# Patient Record
Sex: Female | Born: 2005
Health system: Southern US, Community
[De-identification: ages and names within clinical notes are randomized; demographics above are authoritative.]

## PROBLEM LIST (undated history)

## (undated) DIAGNOSIS — K561 Intussusception: Secondary | ICD-10-CM

## (undated) DIAGNOSIS — L309 Dermatitis, unspecified: Secondary | ICD-10-CM

## (undated) DIAGNOSIS — R519 Headache, unspecified: Secondary | ICD-10-CM

## (undated) DIAGNOSIS — F419 Anxiety disorder, unspecified: Secondary | ICD-10-CM

## (undated) HISTORY — DX: Headache, unspecified: R51.9

## (undated) HISTORY — DX: Dermatitis, unspecified: L30.9

## (undated) HISTORY — DX: Anxiety disorder, unspecified: F41.9

## (undated) HISTORY — DX: Intussusception: K56.1

---
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
## 2024-04-21 DIAGNOSIS — L700 Acne vulgaris: Secondary | ICD-10-CM | POA: Diagnosis not present

## 2024-05-06 DIAGNOSIS — Z1342 Encounter for screening for global developmental delays (milestones): Secondary | ICD-10-CM | POA: Diagnosis not present

## 2024-05-06 DIAGNOSIS — Z862 Personal history of diseases of the blood and blood-forming organs and certain disorders involving the immune mechanism: Secondary | ICD-10-CM | POA: Diagnosis not present

## 2024-05-06 DIAGNOSIS — Z3009 Encounter for other general counseling and advice on contraception: Secondary | ICD-10-CM | POA: Diagnosis not present

## 2024-05-06 DIAGNOSIS — Z1330 Encounter for screening examination for mental health and behavioral disorders, unspecified: Secondary | ICD-10-CM | POA: Diagnosis not present

## 2024-05-25 DIAGNOSIS — L700 Acne vulgaris: Secondary | ICD-10-CM | POA: Diagnosis not present

## 2024-05-26 ENCOUNTER — Emergency Department (HOSPITAL_BASED_OUTPATIENT_CLINIC_OR_DEPARTMENT_OTHER): Admission: EM | Admit: 2024-05-26 | Discharge: 2024-05-26 | Disposition: A | Discharge status: Home / Self Care

## 2024-05-26 ENCOUNTER — Encounter (HOSPITAL_BASED_OUTPATIENT_CLINIC_OR_DEPARTMENT_OTHER): Payer: Self-pay | Admitting: *Deleted

## 2024-05-26 ENCOUNTER — Other Ambulatory Visit: Payer: Self-pay

## 2024-05-26 ENCOUNTER — Emergency Department (HOSPITAL_BASED_OUTPATIENT_CLINIC_OR_DEPARTMENT_OTHER)

## 2024-05-26 DIAGNOSIS — R109 Unspecified abdominal pain: Secondary | ICD-10-CM | POA: Diagnosis not present

## 2024-05-26 DIAGNOSIS — K529 Noninfective gastroenteritis and colitis, unspecified: Secondary | ICD-10-CM | POA: Diagnosis not present

## 2024-05-26 DIAGNOSIS — D72829 Elevated white blood cell count, unspecified: Secondary | ICD-10-CM | POA: Diagnosis not present

## 2024-05-26 LAB — URINALYSIS, ROUTINE W REFLEX MICROSCOPIC
Bilirubin Urine: NEGATIVE
Glucose, UA: NEGATIVE mg/dL
Leukocytes,Ua: NEGATIVE
Nitrite: NEGATIVE
Protein, ur: 30 mg/dL — AB
Specific Gravity, Urine: 1.035 — ABNORMAL HIGH (ref 1.005–1.030)
pH: 5.5 (ref 5.0–8.0)

## 2024-05-26 LAB — COMPREHENSIVE METABOLIC PANEL WITH GFR
ALT: 18 U/L (ref 0–44)
AST: 27 U/L (ref 15–41)
Albumin: 4.8 g/dL (ref 3.5–5.0)
Alkaline Phosphatase: 112 U/L (ref 38–126)
Anion gap: 12 (ref 5–15)
BUN: 10 mg/dL (ref 6–20)
CO2: 25 mmol/L (ref 22–32)
Calcium: 10.3 mg/dL (ref 8.9–10.3)
Chloride: 98 mmol/L (ref 98–111)
Creatinine, Ser: 0.95 mg/dL (ref 0.44–1.00)
GFR, Estimated: >60 mL/min (ref >60)
Glucose, Bld: 113 mg/dL — ABNORMAL HIGH (ref 70–99)
Potassium: 3.8 mmol/L (ref 3.5–5.1)
Sodium: 136 mmol/L (ref 135–145)
Total Bilirubin: 0.5 mg/dL (ref 0.0–1.2)
Total Protein: 8 g/dL (ref 6.5–8.1)

## 2024-05-26 LAB — CBC
HCT: 41.9 % (ref 36.0–46.0)
Hemoglobin: 14.6 g/dL (ref 12.0–15.0)
MCH: 32.2 pg (ref 26.0–34.0)
MCHC: 34.8 g/dL (ref 30.0–36.0)
MCV: 92.3 fL (ref 80.0–100.0)
Platelets: 301 K/uL (ref 150–400)
RBC: 4.54 MIL/uL (ref 3.87–5.11)
RDW: 12.6 % (ref 11.5–15.5)
WBC: 12.4 K/uL — ABNORMAL HIGH (ref 4.0–10.5)
nRBC: 0 % (ref 0.0–0.2)

## 2024-05-26 LAB — LIPASE, BLOOD: Lipase: 37 U/L (ref 11–51)

## 2024-05-26 LAB — PREGNANCY, URINE: Preg Test, Ur: NEGATIVE

## 2024-05-26 MED ORDER — LACTATED RINGERS IV BOLUS
1000.0000 mL | Freq: Once | INTRAVENOUS | Status: AC
  Administered 2024-05-26: 08:00:00 1000 mL via INTRAVENOUS

## 2024-05-26 MED ORDER — DICYCLOMINE HCL 20 MG PO TABS: 20.0000 mg | ORAL_TABLET | Freq: Two times a day (BID) | ORAL | 0 refills | Status: AC

## 2024-05-26 MED ORDER — IOHEXOL 300 MG/ML  SOLN
100.0000 mL | Freq: Once | INTRAMUSCULAR | Status: AC | PRN
  Administered 2024-05-26: 08:00:00 75 mL via INTRAVENOUS

## 2024-05-26 MED ORDER — DICYCLOMINE HCL 10 MG PO CAPS
10.0000 mg | ORAL_CAPSULE | Freq: Once | ORAL | Status: AC
  Administered 2024-05-26: 09:00:00 10 mg via ORAL
  Filled 2024-05-26: qty 1

## 2024-05-26 MED ORDER — MORPHINE SULFATE (PF) 2 MG/ML IV SOLN
2.0000 mg | Freq: Once | INTRAVENOUS | Status: AC
  Administered 2024-05-26: 08:00:00 2 mg via INTRAVENOUS
  Filled 2024-05-26: qty 1

## 2024-05-26 MED ORDER — ONDANSETRON HCL 4 MG/2ML IJ SOLN
4.0000 mg | Freq: Once | INTRAMUSCULAR | Status: AC
  Administered 2024-05-26: 08:00:00 4 mg via INTRAVENOUS
  Filled 2024-05-26: qty 2

## 2024-05-26 MED ORDER — ONDANSETRON 4 MG PO TBDP: 4.0000 mg | ORAL_TABLET | Freq: Three times a day (TID) | ORAL | 0 refills | Status: AC | PRN

## 2024-05-26 NOTE — ED Triage Notes (Signed)
 Pt reports some abdominal discomfort that started on Sunday, she thought may be food intolerance related. The pain has become worse, pain just over the umbilicus. Tactile fevers. Mild nausea. No diarrhea.

## 2024-05-26 NOTE — Discharge Instructions (Addendum)
 Please follow-up with your primary doctor.  We have referred you to GI doctor for further evaluation of your ileitis.  Return for fevers, chills, lightheadedness, passout, chest pain, severe abdominal pain, inability to eat or drink due to nausea vomiting, stop having bowel movements or you develop any new or worsening symptoms that are concerning to you.  You may take the medications as needed symptom relief.  As discussed you may also try bowel rest and slow reintroduction of foods to help with your symptoms.

## 2024-05-26 NOTE — ED Provider Notes (Signed)
 [place] EMERGENCY DEPARTMENT AT [facility] Provider Note   CSN: [id number] Arrival date & time: 05/26/24  [id number]     Patient presents with: Abdominal Pain   Latoya White is a 18 y.o. female.   This is a 18 year old female presented to the emergency department for abdominal pain.  Started Sunday constant, but varies in intensity.  Seemingly worsening.  Pain was more in her upper abdomen but has gradually moved down.  Patient and mother noted fevers at home this morning.  She had some nausea in triage, but has not had any prior to that.  No vomiting.  Normal bowel movement this morning.  No prior surgeries.  Last menstrual period was a few weeks ago   Abdominal Pain      Prior to Admission medications  Medication Sig Start Date End Date Taking? Authorizing Provider  dicyclomine  (BENTYL ) 20 MG tablet Take 1 tablet (20 mg total) by mouth 2 (two) times daily. 05/26/24  Yes Neysa Caron PARAS, DO  ondansetron  (ZOFRAN -ODT) 4 MG disintegrating tablet Take 1 tablet (4 mg total) by mouth every 8 (eight) hours as needed for nausea or vomiting. 05/26/24  Yes Neysa Caron PARAS, DO  ibuprofen  (ADVIL ) 600 MG tablet Take 1 tablet at onset of migraine. May repeat q 4-6 hours as needed 11/16/19   Marianna City, NP  Melatonin 5 MG CAPS Take by mouth.    [provider]  methylphenidate  (CONCERTA ) 18 MG PO CR tablet Take 1 tablet (18 mg total) by mouth daily. Patient not taking: Reported on 01/28/2020 10/23/19   Viviana Fitch T, FNP  spironolactone (ALDACTONE) 100 MG tablet Take 100 mg by mouth daily. 09/20/20   [provider]  tretinoin (RETIN-A) 0.025 % cream Apply 1 application topically at bedtime. 10/01/20   [provider]    Allergies: Amoxicillin    Review of Systems  Gastrointestinal:  Positive for abdominal pain.    Updated Vital Signs BP 119/78   Pulse 82   Temp 99.3 F (37.4 C) (Oral)   Resp 17   Ht 5' 7 (1.702 m)   Wt 61.2 kg   SpO2 96%    BMI 21.14 kg/m   Physical Exam Vitals and nursing note reviewed.  Constitutional:      General: She is not in acute distress.    Appearance: She is not toxic-appearing.  HENT:     Head: Normocephalic.  Cardiovascular:     Rate and Rhythm: Normal rate and regular rhythm.  Pulmonary:     Effort: Pulmonary effort is normal.     Breath sounds: Normal breath sounds.  Abdominal:     General: Abdomen is flat.     Palpations: Abdomen is soft.     Tenderness: There is abdominal tenderness in the right lower quadrant, periumbilical area and left lower quadrant.  Skin:    General: Skin is warm and dry.     Capillary Refill: Capillary refill takes less than 2 seconds.  Neurological:     General: No focal deficit present.     Mental Status: She is alert.  Psychiatric:        Mood and Affect: Mood normal.        Behavior: Behavior normal.     (all labs ordered are listed, but only abnormal results are displayed) Labs Reviewed  COMPREHENSIVE METABOLIC PANEL WITH GFR - Abnormal; Notable for the following components:      Result Value   Glucose, Bld 113 (*)    All  other components within normal limits  CBC - Abnormal; Notable for the following components:   WBC 12.4 (*)    All other components within normal limits  URINALYSIS, ROUTINE W REFLEX MICROSCOPIC - Abnormal; Notable for the following components:   Specific Gravity, Urine 1.035 (*)    Hgb urine dipstick TRACE (*)    Ketones, ur TRACE (*)    Protein, ur 30 (*)    Bacteria, UA RARE (*)    All other components within normal limits  LIPASE, BLOOD  PREGNANCY, URINE    EKG: None  Radiology: CT ABDOMEN PELVIS W CONTRAST Result Date: 05/26/2024 EXAM: CT ABDOMEN AND PELVIS WITH CONTRAST 05/26/2024 08:02:48 AM TECHNIQUE: CT of the abdomen and pelvis was performed with the administration of 75 mL of iohexol  (OMNIPAQUE ) 300 MG/ML solution. Multiplanar reformatted images are provided for review. Automated exposure control,  iterative reconstruction, and/or weight-based adjustment of the mA/kV was utilized to reduce the radiation dose to as low as reasonably achievable. COMPARISON: None available. CLINICAL HISTORY: RLQ abdominal pain. FINDINGS: LOWER CHEST: No acute abnormality. LIVER: The liver is unremarkable. GALLBLADDER AND BILE DUCTS: Gallbladder is unremarkable. No biliary ductal dilatation. SPLEEN: The spleen is within normal limits in size and appearance. PANCREAS: The pancreas is normal in size and contour without focal lesion or ductal dilatation. ADRENAL GLANDS: Normal size and morphology bilaterally. No nodule, thickening, or hemorrhage. No periadrenal stranding. KIDNEYS, URETERS AND BLADDER: No stones in the kidneys or ureters. No hydronephrosis. No perinephric or periureteral stranding. Urinary bladder is unremarkable. GI AND BOWEL: Stomach demonstrates no acute abnormality. The appendix is visualized and appears within normal limits. There is wall thickening and mild mucosal enhancement involving the terminal ileum with wall thickening of the terminal ileum measuring up to 1 cm. No significant surrounding inflammatory soft tissue stranding. Mild diffuse colonic stool burden is identified. There is no bowel obstruction. PERITONEUM AND RETROPERITONEUM: Trace free fluid noted within the pelvis. No free air. VASCULATURE: Aorta is normal in caliber. LYMPH NODES: Right lower quadrant iliopsoas lymph nodes are prominent measuring up to 8 mm short axis. REPRODUCTIVE ORGANS: The uterus appears within normal limits. Left ovarian cyst measures 2 cm, image 20/2. BONES AND SOFT TISSUES: No acute osseous abnormality. No focal soft tissue abnormality. IMPRESSION: 1. Wall thickening and mild mucosal enhancement of the terminal ileum up to 1 cm without significant surrounding inflammatory stranding, which may reflect terminal ileitis. 2. Prominent right lower quadrant iliopsoas lymph nodes measuring up to 8 mm short axis, likely reactive.  3. Simple-appearing left ovarian cyst measuring 2 cm, no follow-up recommended. Electronically signed by: Waddell Calk MD 05/26/2024 08:24 AM EST RP Workstation: [id number]     Procedures   Medications Ordered in the ED  morphine  (PF) 2 MG/ML injection 2 mg (2 mg Intravenous Given 05/26/24 0744)  ondansetron  (ZOFRAN ) injection 4 mg (4 mg Intravenous Given 05/26/24 0744)  lactated ringers  bolus 1,000 mL (0 mLs Intravenous Stopped 05/26/24 0942)  iohexol  (OMNIPAQUE ) 300 MG/ML solution 100 mL (75 mLs Intravenous Contrast Given 05/26/24 0752)  dicyclomine  (BENTYL ) capsule 10 mg (10 mg Oral Given 05/26/24 0838)    Clinical Course as of 05/26/24 1427  Tue May 26, 2024  0842 Workup reassuring overall reassuring.  I suspect likely a viral gastroenteritis type clinical picture picture.  No significant electrolyte abnormalities.  Normal kidney function.  No transaminitis or elevated bilirubin to suggest acute hepatobiliary disease.  Lipase normal.  Pancreatitis unlikely.  Does have mild leukocytosis which could be reactionary versus  viral.  No anemia.  UA without evidence of urinary tract infection.  Pregnancy test is negative.  CT abdomen pelvis with normal appendix.  Did have some ileitis.  Is not having any other classic signs or symptoms for Crohn's/ulcerative colitis.  She is feeling improved after medications and fluids.  Do not think she warrants inpatient treatment.  Discussed follow-up with primary doctor.  Will refer to GI out of caution to evaluate for possible Crohn's/ulcerative colitis.  She did have a small left ovarian cyst, low suspicion for torsion.  Does have an ultrasound of her pelvis tomorrow for IUD placement.  Will discharge with supportive medications.  Patient was given return precautions.  All questions answered.  Stable for discharge. [TY]    Clinical Course User Index [TY] Neysa Caron PARAS, DO                                 Medical Decision Making This is a well-appearing  18 year old female presenting emergency department for abdominal pain.  She is mildly tachycardic, but normotensive.  Soft abdomen, but does have significant tenderness in her right lower quadrant as well as her left lower quadrant.  Labs today with leukocytosis.  Concern for appendicitis.  Will get CT scan to evaluate.  Her other labs with no significant abnormality.  She has got no AKI.  No transaminitis or elevated lipase to suggest acute pancreatitis or hepatobiliary disease.  UA without evidence of urinary tract infection.  Her pregnancy test is negative, ectopic pregnancy unlikely.  The description of her pain that has migrated to her lower quadrant makes ovarian torsion unlikely.  Given morphine  IV fluids and Zofran  for pain.  See ED course for further MDM and final disposition.  Amount and/or Complexity of Data Reviewed Labs: ordered. Radiology: ordered.  Risk Prescription drug management.      Final diagnoses:  Ileitis    ED Discharge Orders          Ordered    Ambulatory referral to Gastroenterology        05/26/24 0846    ondansetron  (ZOFRAN -ODT) 4 MG disintegrating tablet  Every 8 hours PRN        05/26/24 0847    dicyclomine  (BENTYL ) 20 MG tablet  2 times daily        05/26/24 9152               Neysa Caron PARAS, DO 05/26/24 1427

## 2024-05-27 DIAGNOSIS — Z3009 Encounter for other general counseling and advice on contraception: Secondary | ICD-10-CM | POA: Diagnosis not present

## 2024-05-27 DIAGNOSIS — Z862 Personal history of diseases of the blood and blood-forming organs and certain disorders involving the immune mechanism: Secondary | ICD-10-CM | POA: Diagnosis not present

## 2024-05-28 ENCOUNTER — Encounter: Payer: Self-pay | Admitting: Gastroenterology

## 2024-07-03 ENCOUNTER — Ambulatory Visit: Admitting: Gastroenterology
